# Patient Record
Sex: Female | Born: 1985 | Race: White | Hispanic: No | Marital: Single
Health system: Southern US, Community
[De-identification: ages and names within clinical notes are randomized; demographics above are authoritative.]

## PROBLEM LIST (undated history)

## (undated) DIAGNOSIS — F32A Depression, unspecified: Secondary | ICD-10-CM

## (undated) DIAGNOSIS — F329 Major depressive disorder, single episode, unspecified: Secondary | ICD-10-CM

## (undated) SURGERY — OPEN REDUCTION INTERNAL FIXATION (ORIF) DISTAL HUMERUS FRACTURE
Anesthesia: Choice | Laterality: Left

---

## 2016-05-16 ENCOUNTER — Emergency Department (HOSPITAL_COMMUNITY): Payer: No Typology Code available for payment source

## 2016-05-16 ENCOUNTER — Observation Stay (HOSPITAL_COMMUNITY): Payer: No Typology Code available for payment source

## 2016-05-16 ENCOUNTER — Inpatient Hospital Stay (HOSPITAL_COMMUNITY)
Admission: EM | Admit: 2016-05-16 | Discharge: 2016-05-19 | DRG: 493 | Disposition: A | Discharge status: Home / Self Care | Payer: No Typology Code available for payment source | Attending: General Surgery | Admitting: General Surgery

## 2016-05-16 ENCOUNTER — Encounter (HOSPITAL_COMMUNITY): Payer: Self-pay | Admitting: Emergency Medicine

## 2016-05-16 DIAGNOSIS — S52002A Unspecified fracture of upper end of left ulna, initial encounter for closed fracture: Secondary | ICD-10-CM

## 2016-05-16 DIAGNOSIS — S32029A Unspecified fracture of second lumbar vertebra, initial encounter for closed fracture: Secondary | ICD-10-CM | POA: Diagnosis present

## 2016-05-16 DIAGNOSIS — S32019A Unspecified fracture of first lumbar vertebra, initial encounter for closed fracture: Secondary | ICD-10-CM | POA: Diagnosis present

## 2016-05-16 DIAGNOSIS — S42409A Unspecified fracture of lower end of unspecified humerus, initial encounter for closed fracture: Secondary | ICD-10-CM | POA: Diagnosis present

## 2016-05-16 DIAGNOSIS — S42402A Unspecified fracture of lower end of left humerus, initial encounter for closed fracture: Secondary | ICD-10-CM | POA: Diagnosis not present

## 2016-05-16 DIAGNOSIS — R4182 Altered mental status, unspecified: Secondary | ICD-10-CM | POA: Diagnosis not present

## 2016-05-16 DIAGNOSIS — S060X9A Concussion with loss of consciousness of unspecified duration, initial encounter: Secondary | ICD-10-CM | POA: Diagnosis present

## 2016-05-16 DIAGNOSIS — S60221A Contusion of right hand, initial encounter: Secondary | ICD-10-CM | POA: Diagnosis present

## 2016-05-16 DIAGNOSIS — S21119A Laceration without foreign body of unspecified front wall of thorax without penetration into thoracic cavity, initial encounter: Secondary | ICD-10-CM | POA: Diagnosis present

## 2016-05-16 DIAGNOSIS — S32039A Unspecified fracture of third lumbar vertebra, initial encounter for closed fracture: Secondary | ICD-10-CM | POA: Diagnosis present

## 2016-05-16 DIAGNOSIS — F329 Major depressive disorder, single episode, unspecified: Secondary | ICD-10-CM | POA: Diagnosis present

## 2016-05-16 DIAGNOSIS — S060XAA Concussion with loss of consciousness status unknown, initial encounter: Secondary | ICD-10-CM | POA: Diagnosis present

## 2016-05-16 DIAGNOSIS — S52609A Unspecified fracture of lower end of unspecified ulna, initial encounter for closed fracture: Secondary | ICD-10-CM | POA: Diagnosis present

## 2016-05-16 HISTORY — DX: Depression, unspecified: F32.A

## 2016-05-16 HISTORY — DX: Major depressive disorder, single episode, unspecified: F32.9

## 2016-05-16 LAB — COMPREHENSIVE METABOLIC PANEL
ALBUMIN: 3.9 g/dL (ref 3.5–5.0)
ALT: 42 U/L (ref 14–54)
ANION GAP: 8 (ref 5–15)
AST: 40 U/L (ref 15–41)
Alkaline Phosphatase: 37 U/L — ABNORMAL LOW (ref 38–126)
BILIRUBIN TOTAL: 0.6 mg/dL (ref 0.3–1.2)
BUN: 11 mg/dL (ref 6–20)
CHLORIDE: 104 mmol/L (ref 101–111)
CO2: 23 mmol/L (ref 22–32)
Calcium: 9.5 mg/dL (ref 8.9–10.3)
Creatinine, Ser: 1.04 mg/dL — ABNORMAL HIGH (ref 0.44–1.00)
GFR calc Af Amer: >60 mL/min (ref >60)
GLUCOSE: 156 mg/dL — AB (ref 65–99)
POTASSIUM: 3.8 mmol/L (ref 3.5–5.1)
Sodium: 135 mmol/L (ref 135–145)
TOTAL PROTEIN: 7.6 g/dL (ref 6.5–8.1)

## 2016-05-16 LAB — URINALYSIS, ROUTINE W REFLEX MICROSCOPIC
Bilirubin Urine: NEGATIVE
Glucose, UA: NEGATIVE mg/dL
Ketones, ur: NEGATIVE mg/dL
Leukocytes, UA: NEGATIVE
NITRITE: NEGATIVE
PH: 8 (ref 5.0–8.0)
Protein, ur: NEGATIVE mg/dL
SPECIFIC GRAVITY, URINE: 1.02 (ref 1.005–1.030)

## 2016-05-16 LAB — I-STAT CHEM 8, ED
BUN: 12 mg/dL (ref 6–20)
CALCIUM ION: 1.12 mmol/L (ref 1.12–1.23)
Chloride: 101 mmol/L (ref 101–111)
Creatinine, Ser: 0.9 mg/dL (ref 0.44–1.00)
Glucose, Bld: 156 mg/dL — ABNORMAL HIGH (ref 65–99)
HEMATOCRIT: 47 % — AB (ref 36.0–46.0)
HEMOGLOBIN: 16 g/dL — AB (ref 12.0–15.0)
Potassium: 3.8 mmol/L (ref 3.5–5.1)
SODIUM: 138 mmol/L (ref 135–145)
TCO2: 23 mmol/L (ref 0–100)

## 2016-05-16 LAB — CBC
HEMATOCRIT: 43.6 % (ref 36.0–46.0)
HEMOGLOBIN: 15.3 g/dL — AB (ref 12.0–15.0)
MCH: 31.9 pg (ref 26.0–34.0)
MCHC: 35.1 g/dL (ref 30.0–36.0)
MCV: 90.8 fL (ref 78.0–100.0)
Platelets: 265 10*3/uL (ref 150–400)
RBC: 4.8 MIL/uL (ref 3.87–5.11)
RDW: 13.1 % (ref 11.5–15.5)
WBC: 8.4 10*3/uL (ref 4.0–10.5)

## 2016-05-16 LAB — RAPID URINE DRUG SCREEN, HOSP PERFORMED
Amphetamines: NOT DETECTED
BARBITURATES: NOT DETECTED
Benzodiazepines: NOT DETECTED
Cocaine: NOT DETECTED
Opiates: POSITIVE — AB
TETRAHYDROCANNABINOL: NOT DETECTED

## 2016-05-16 LAB — I-STAT CG4 LACTIC ACID, ED: LACTIC ACID, VENOUS: 2.88 mmol/L — AB (ref 0.5–2.0)

## 2016-05-16 LAB — URINE MICROSCOPIC-ADD ON

## 2016-05-16 LAB — SAMPLE TO BLOOD BANK

## 2016-05-16 LAB — PROTIME-INR
INR: 1.04 (ref 0.00–1.49)
PROTHROMBIN TIME: 13.8 s (ref 11.6–15.2)

## 2016-05-16 LAB — ETHANOL: Alcohol, Ethyl (B): <5 mg/dL (ref <5)

## 2016-05-16 LAB — POC URINE PREG, ED: Preg Test, Ur: NEGATIVE

## 2016-05-16 LAB — CDS SEROLOGY

## 2016-05-16 MED ORDER — HYDROMORPHONE HCL 1 MG/ML IJ SOLN
1.0000 mg | INTRAMUSCULAR | Status: DC | PRN
  Administered 2016-05-16 (×2): 1 mg via INTRAVENOUS
  Filled 2016-05-16 (×2): qty 1

## 2016-05-16 MED ORDER — CHLORHEXIDINE GLUCONATE 4 % EX LIQD
60.0000 mL | Freq: Once | CUTANEOUS | Status: DC
  Filled 2016-05-16: qty 60

## 2016-05-16 MED ORDER — HYDROMORPHONE HCL 1 MG/ML IJ SOLN
1.0000 mg | Freq: Once | INTRAMUSCULAR | Status: AC
  Administered 2016-05-16: 1 mg via INTRAVENOUS
  Filled 2016-05-16: qty 1

## 2016-05-16 MED ORDER — SODIUM CHLORIDE 0.9 % IV SOLN
INTRAVENOUS | Status: DC
  Administered 2016-05-16: 16:00:00 via INTRAVENOUS

## 2016-05-16 MED ORDER — SODIUM CHLORIDE 0.9% FLUSH
9.0000 mL | INTRAVENOUS | Status: DC | PRN
Start: 2016-05-16 — End: 2016-05-18

## 2016-05-16 MED ORDER — OXYCODONE HCL 5 MG PO TABS
10.0000 mg | ORAL_TABLET | ORAL | Status: DC | PRN
  Administered 2016-05-16 – 2016-05-18 (×5): 10 mg via ORAL
  Filled 2016-05-16 (×5): qty 2

## 2016-05-16 MED ORDER — DIPHENHYDRAMINE HCL 12.5 MG/5ML PO ELIX
12.5000 mg | ORAL_SOLUTION | Freq: Four times a day (QID) | ORAL | Status: DC | PRN
  Administered 2016-05-18: 12.5 mg via ORAL
  Filled 2016-05-16: qty 10

## 2016-05-16 MED ORDER — IOPAMIDOL (ISOVUE-300) INJECTION 61%
INTRAVENOUS | Status: AC
  Administered 2016-05-16: 100 mL
  Filled 2016-05-16: qty 100

## 2016-05-16 MED ORDER — DIPHENHYDRAMINE HCL 50 MG/ML IJ SOLN: 12.5000 mg | Freq: Four times a day (QID) | INTRAMUSCULAR | Status: DC | PRN

## 2016-05-16 MED ORDER — HYDROMORPHONE HCL 1 MG/ML IJ SOLN
1.0000 mg | INTRAMUSCULAR | Status: DC | PRN
  Administered 2016-05-16 (×2): 1 mg via INTRAVENOUS
  Administered 2016-05-17: 21:00:00 via INTRAVENOUS
  Filled 2016-05-16 (×2): qty 1

## 2016-05-16 MED ORDER — ONDANSETRON HCL 4 MG PO TABS: 4.0000 mg | ORAL_TABLET | Freq: Four times a day (QID) | ORAL | Status: DC | PRN

## 2016-05-16 MED ORDER — OXYCODONE HCL 5 MG PO TABS: 5.0000 mg | ORAL_TABLET | ORAL | Status: DC | PRN

## 2016-05-16 MED ORDER — SODIUM CHLORIDE 0.9 % IV BOLUS (SEPSIS)
1000.0000 mL | Freq: Once | INTRAVENOUS | Status: AC
  Administered 2016-05-16: 1000 mL via INTRAVENOUS

## 2016-05-16 MED ORDER — ENOXAPARIN SODIUM 40 MG/0.4ML ~~LOC~~ SOLN
40.0000 mg | SUBCUTANEOUS | Status: DC
  Administered 2016-05-19: 40 mg via SUBCUTANEOUS
  Filled 2016-05-16 (×2): qty 0.4

## 2016-05-16 MED ORDER — CEFAZOLIN SODIUM-DEXTROSE 2-4 GM/100ML-% IV SOLN
2.0000 g | INTRAVENOUS | Status: DC
  Filled 2016-05-16: qty 100

## 2016-05-16 MED ORDER — TETANUS-DIPHTH-ACELL PERTUSSIS 5-2.5-18.5 LF-MCG/0.5 IM SUSP
0.5000 mL | Freq: Once | INTRAMUSCULAR | Status: AC
  Administered 2016-05-16: 0.5 mL via INTRAMUSCULAR
  Filled 2016-05-16: qty 0.5

## 2016-05-16 MED ORDER — NALOXONE HCL 0.4 MG/ML IJ SOLN: 0.4000 mg | INTRAMUSCULAR | Status: DC | PRN

## 2016-05-16 MED ORDER — HYDROMORPHONE 1 MG/ML IV SOLN
INTRAVENOUS | Status: DC
  Administered 2016-05-16 – 2016-05-17 (×2): via INTRAVENOUS
  Administered 2016-05-17: 3.3 mg via INTRAVENOUS
  Administered 2016-05-17: 2.4 mg via INTRAVENOUS
  Administered 2016-05-17: 3.9 mg via INTRAVENOUS
  Administered 2016-05-17: 5.1 mg via INTRAVENOUS
  Administered 2016-05-18: 2.2 mg via INTRAVENOUS
  Administered 2016-05-18: 4.1 mg via INTRAVENOUS
  Filled 2016-05-16 (×2): qty 25

## 2016-05-16 MED ORDER — ONDANSETRON HCL 4 MG/2ML IJ SOLN: 4.0000 mg | Freq: Four times a day (QID) | INTRAMUSCULAR | Status: DC | PRN

## 2016-05-16 NOTE — ED Notes (Signed)
Patient returned from X-ray 

## 2016-05-16 NOTE — ED Notes (Signed)
Pt out of room for testing. 

## 2016-05-16 NOTE — Progress Notes (Signed)
Dr. Mina MarbleWeingold called per family request, family was concerned about upcoming surgery and had several questions.  Dr. Bernita RaisinWeigngold indicated he will speak with family in person this evening.  Family made aware.
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
## 2016-05-16 NOTE — ED Notes (Signed)
Ortho Tech at bedside for splint placement

## 2016-05-16 NOTE — Progress Notes (Signed)
Patient indicates her pain is not being controlled with current pain medication regimen.  MD paged orders to be entered.
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
## 2016-05-16 NOTE — ED Notes (Signed)
Patient transported to CT 
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
## 2016-05-16 NOTE — Progress Notes (Signed)
PCA set-up per order for improved pain control.  Family received education on PCA usage, incentive spirometry, arm elevation, and cold therapy.

## 2016-05-16 NOTE — Progress Notes (Signed)
Orthopedic Tech Progress Note Patient Details:  Valetta MoleLeah C Reppucci 26-Aug-1986 [id number]  Ortho Devices Type of Ortho Device: Arm sling, Long arm splint Ortho Device/Splint Interventions: Application   Saul FordyceJennifer C Eleftherios Dudenhoeffer 05/16/2016, 1:07 PM
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
## 2016-05-17 ENCOUNTER — Encounter (HOSPITAL_COMMUNITY): Admission: EM | Disposition: A | Discharge status: Home / Self Care | Payer: Self-pay | Source: Home / Self Care

## 2016-05-17 ENCOUNTER — Observation Stay (HOSPITAL_COMMUNITY): Payer: No Typology Code available for payment source | Admitting: Anesthesiology

## 2016-05-17 ENCOUNTER — Encounter (HOSPITAL_COMMUNITY): Payer: Self-pay | Admitting: Certified Registered Nurse Anesthetist

## 2016-05-17 DIAGNOSIS — S21119A Laceration without foreign body of unspecified front wall of thorax without penetration into thoracic cavity, initial encounter: Secondary | ICD-10-CM | POA: Diagnosis present

## 2016-05-17 DIAGNOSIS — S32019A Unspecified fracture of first lumbar vertebra, initial encounter for closed fracture: Secondary | ICD-10-CM | POA: Diagnosis present

## 2016-05-17 DIAGNOSIS — S42402A Unspecified fracture of lower end of left humerus, initial encounter for closed fracture: Secondary | ICD-10-CM | POA: Diagnosis present

## 2016-05-17 DIAGNOSIS — S32029A Unspecified fracture of second lumbar vertebra, initial encounter for closed fracture: Secondary | ICD-10-CM | POA: Diagnosis present

## 2016-05-17 DIAGNOSIS — S32039A Unspecified fracture of third lumbar vertebra, initial encounter for closed fracture: Secondary | ICD-10-CM | POA: Diagnosis present

## 2016-05-17 DIAGNOSIS — S60221A Contusion of right hand, initial encounter: Secondary | ICD-10-CM | POA: Diagnosis present

## 2016-05-17 DIAGNOSIS — S52609A Unspecified fracture of lower end of unspecified ulna, initial encounter for closed fracture: Secondary | ICD-10-CM | POA: Diagnosis present

## 2016-05-17 DIAGNOSIS — F329 Major depressive disorder, single episode, unspecified: Secondary | ICD-10-CM | POA: Diagnosis present

## 2016-05-17 DIAGNOSIS — R4182 Altered mental status, unspecified: Secondary | ICD-10-CM | POA: Diagnosis present

## 2016-05-17 DIAGNOSIS — S060X9A Concussion with loss of consciousness of unspecified duration, initial encounter: Secondary | ICD-10-CM | POA: Diagnosis present

## 2016-05-17 HISTORY — PX: ORIF ELBOW FRACTURE: SHX5031

## 2016-05-17 LAB — MRSA PCR SCREENING: MRSA by PCR: NEGATIVE

## 2016-05-17 SURGERY — OPEN REDUCTION INTERNAL FIXATION (ORIF) ELBOW/OLECRANON FRACTURE
Anesthesia: Regional | Site: Elbow | Laterality: Left

## 2016-05-17 MED ORDER — HYDROMORPHONE HCL 1 MG/ML IJ SOLN
0.2500 mg | INTRAMUSCULAR | Status: DC | PRN
  Administered 2016-05-17 (×2): 0.5 mg via INTRAVENOUS

## 2016-05-17 MED ORDER — PROPOFOL 10 MG/ML IV BOLUS
INTRAVENOUS | Status: DC | PRN
  Administered 2016-05-17: 200 mg via INTRAVENOUS

## 2016-05-17 MED ORDER — FENTANYL CITRATE (PF) 250 MCG/5ML IJ SOLN
INTRAMUSCULAR | Status: AC
  Filled 2016-05-17: qty 5

## 2016-05-17 MED ORDER — ONDANSETRON HCL 4 MG/2ML IJ SOLN: 4.0000 mg | Freq: Four times a day (QID) | INTRAMUSCULAR | Status: DC | PRN

## 2016-05-17 MED ORDER — OXYCODONE HCL 5 MG/5ML PO SOLN: 5.0000 mg | Freq: Once | ORAL | Status: DC | PRN

## 2016-05-17 MED ORDER — FENTANYL CITRATE (PF) 250 MCG/5ML IJ SOLN
INTRAMUSCULAR | Status: DC | PRN
  Administered 2016-05-17: 100 ug via INTRAVENOUS
  Administered 2016-05-17 (×5): 50 ug via INTRAVENOUS
  Administered 2016-05-17: 150 ug via INTRAVENOUS

## 2016-05-17 MED ORDER — DEXAMETHASONE SODIUM PHOSPHATE 10 MG/ML IJ SOLN
INTRAMUSCULAR | Status: DC | PRN
  Administered 2016-05-17: 10 mg via INTRAVENOUS

## 2016-05-17 MED ORDER — LIDOCAINE HCL (CARDIAC) 20 MG/ML IV SOLN
INTRAVENOUS | Status: DC | PRN
  Administered 2016-05-17: 100 mg via INTRAVENOUS

## 2016-05-17 MED ORDER — ONDANSETRON HCL 4 MG/2ML IJ SOLN
INTRAMUSCULAR | Status: DC | PRN
  Administered 2016-05-17: 4 mg via INTRAVENOUS

## 2016-05-17 MED ORDER — OXYCODONE HCL 5 MG PO TABS: 5.0000 mg | ORAL_TABLET | Freq: Once | ORAL | Status: DC | PRN

## 2016-05-17 MED ORDER — METHOCARBAMOL 1000 MG/10ML IJ SOLN: 500.0000 mg | Freq: Four times a day (QID) | INTRAVENOUS | Status: DC | PRN

## 2016-05-17 MED ORDER — LIDOCAINE 2% (20 MG/ML) 5 ML SYRINGE
INTRAMUSCULAR | Status: AC
  Filled 2016-05-17: qty 5

## 2016-05-17 MED ORDER — ONDANSETRON HCL 4 MG/2ML IJ SOLN
INTRAMUSCULAR | Status: AC
  Filled 2016-05-17: qty 2

## 2016-05-17 MED ORDER — BUPIVACAINE-EPINEPHRINE (PF) 0.5% -1:200000 IJ SOLN
INTRAMUSCULAR | Status: DC | PRN
  Administered 2016-05-17: 30 mL via PERINEURAL

## 2016-05-17 MED ORDER — CEFAZOLIN SODIUM 1 G IJ SOLR
INTRAMUSCULAR | Status: DC | PRN
  Administered 2016-05-17: 2 g via INTRAMUSCULAR

## 2016-05-17 MED ORDER — SUCCINYLCHOLINE CHLORIDE 20 MG/ML IJ SOLN
INTRAMUSCULAR | Status: DC | PRN
  Administered 2016-05-17: 100 mg via INTRAVENOUS

## 2016-05-17 MED ORDER — HYDROMORPHONE HCL 1 MG/ML IJ SOLN
INTRAMUSCULAR | Status: AC
  Filled 2016-05-17: qty 1

## 2016-05-17 MED ORDER — SUCCINYLCHOLINE CHLORIDE 200 MG/10ML IV SOSY
PREFILLED_SYRINGE | INTRAVENOUS | Status: AC
  Filled 2016-05-17: qty 10

## 2016-05-17 MED ORDER — 0.9 % SODIUM CHLORIDE (POUR BTL) OPTIME
TOPICAL | Status: DC | PRN
  Administered 2016-05-17 (×2): 1000 mL

## 2016-05-17 MED ORDER — PROPOFOL 10 MG/ML IV BOLUS
INTRAVENOUS | Status: AC
  Filled 2016-05-17: qty 20

## 2016-05-17 MED ORDER — METHOCARBAMOL 500 MG PO TABS
500.0000 mg | ORAL_TABLET | Freq: Four times a day (QID) | ORAL | Status: DC | PRN
  Administered 2016-05-17 – 2016-05-18 (×4): 500 mg via ORAL
  Filled 2016-05-17 (×4): qty 1

## 2016-05-17 MED ORDER — LACTATED RINGERS IV SOLN
INTRAVENOUS | Status: DC | PRN
  Administered 2016-05-17 (×3): via INTRAVENOUS

## 2016-05-17 MED ORDER — MIDAZOLAM HCL 2 MG/2ML IJ SOLN
INTRAMUSCULAR | Status: DC | PRN
  Administered 2016-05-17: 2 mg via INTRAVENOUS

## 2016-05-17 MED ORDER — CEFAZOLIN SODIUM 1 G IJ SOLR
INTRAMUSCULAR | Status: AC
  Filled 2016-05-17: qty 20

## 2016-05-17 MED ORDER — DEXAMETHASONE SODIUM PHOSPHATE 10 MG/ML IJ SOLN
INTRAMUSCULAR | Status: AC
  Filled 2016-05-17: qty 1

## 2016-05-17 MED ORDER — MIDAZOLAM HCL 2 MG/2ML IJ SOLN
INTRAMUSCULAR | Status: AC
  Filled 2016-05-17: qty 2

## 2016-05-17 SURGICAL SUPPLY — 83 items
BANDAGE ACE 4X5 VEL STRL LF (GAUZE/BANDAGES/DRESSINGS) ×3 IMPLANT
BANDAGE ELASTIC 4 VELCRO ST LF (GAUZE/BANDAGES/DRESSINGS) IMPLANT
BIT DRILL 2.5X2.75 QC CALB (BIT) ×3 IMPLANT
BIT DRILL CALIBRATED 2.7 (BIT) ×2 IMPLANT
BIT DRILL CALIBRATED 2.7MM (BIT) ×1
BLADE AVERAGE 25MMX9MM (BLADE) ×1
BLADE AVERAGE 25X9 (BLADE) ×2 IMPLANT
BLADE MINI RND TIP GREEN BEAV (BLADE) IMPLANT
BNDG CMPR 9X4 STRL LF SNTH (GAUZE/BANDAGES/DRESSINGS) ×1
BNDG ESMARK 4X9 LF (GAUZE/BANDAGES/DRESSINGS) ×3 IMPLANT
BNDG GAUZE ELAST 4 BULKY (GAUZE/BANDAGES/DRESSINGS) ×3 IMPLANT
CLOSURE WOUND 1/2 X4 (GAUZE/BANDAGES/DRESSINGS)
CORDS BIPOLAR (ELECTRODE) ×3 IMPLANT
COVER MAYO STAND STRL (DRAPES) IMPLANT
COVER SURGICAL LIGHT HANDLE (MISCELLANEOUS) ×3 IMPLANT
CUFF TOURNIQUET SINGLE 18IN (TOURNIQUET CUFF) IMPLANT
CUFF TOURNIQUET SINGLE 24IN (TOURNIQUET CUFF) ×3 IMPLANT
DRAPE OEC MINIVIEW 54X84 (DRAPES) ×3 IMPLANT
DRAPE PROXIMA HALF (DRAPES) ×12 IMPLANT
DRAPE SURG 17X23 STRL (DRAPES) ×6 IMPLANT
ELECT NEEDLE TIP 2.8 STRL (NEEDLE) ×3 IMPLANT
GAUZE SPONGE 4X4 12PLY STRL (GAUZE/BANDAGES/DRESSINGS) IMPLANT
GAUZE XEROFORM 1X8 LF (GAUZE/BANDAGES/DRESSINGS) IMPLANT
GAUZE XEROFORM 5X9 LF (GAUZE/BANDAGES/DRESSINGS) ×3 IMPLANT
GLOVE SURG SYN 8.0 (GLOVE) ×3 IMPLANT
GOWN STRL REUS W/ TWL LRG LVL3 (GOWN DISPOSABLE) ×1 IMPLANT
GOWN STRL REUS W/ TWL XL LVL3 (GOWN DISPOSABLE) ×1 IMPLANT
GOWN STRL REUS W/TWL LRG LVL3 (GOWN DISPOSABLE) ×3
GOWN STRL REUS W/TWL XL LVL3 (GOWN DISPOSABLE) ×3
K-WIRE FIXATION 2.0X6 (WIRE) ×15
KIT BASIN OR (CUSTOM PROCEDURE TRAY) ×3 IMPLANT
KIT ROOM TURNOVER OR (KITS) ×3 IMPLANT
KWIRE FIXATION 2.0X6 (WIRE) ×5 IMPLANT
LOOP VESSEL MAXI BLUE (MISCELLANEOUS) ×3 IMPLANT
MANIFOLD NEPTUNE II (INSTRUMENTS) ×3 IMPLANT
NEEDLE HYPO 25GX1X1/2 BEV (NEEDLE) IMPLANT
NS IRRIG 1000ML POUR BTL (IV SOLUTION) ×3 IMPLANT
PACK ORTHO EXTREMITY (CUSTOM PROCEDURE TRAY) ×3 IMPLANT
PAD ARMBOARD 7.5X6 YLW CONV (MISCELLANEOUS) ×6 IMPLANT
PAD CAST 4YDX4 CTTN HI CHSV (CAST SUPPLIES) ×2 IMPLANT
PADDING CAST COTTON 4X4 STRL (CAST SUPPLIES) ×6
PENCIL BUTTON HOLSTER BLD 10FT (ELECTRODE) ×3 IMPLANT
PLATE DIST HUM MEDIAL LT SM (Plate) ×3 IMPLANT
PLATE LOCK LT SM (Plate) ×3 IMPLANT
PLATE OLECRANON SM (Plate) ×3 IMPLANT
SCREW CORT 3.5X26 (Screw) ×3 IMPLANT
SCREW CORT T15 26X3.5XST LCK (Screw) ×1 IMPLANT
SCREW CORT T15 28X3.5XST LCK (Screw) ×1 IMPLANT
SCREW CORTICAL 3.5X28MM (Screw) ×3 IMPLANT
SCREW CORTICAL LOW PROF 3.5X20 (Screw) ×3 IMPLANT
SCREW LOCK CORT STAR 3.5X12 (Screw) ×6 IMPLANT
SCREW LOCK CORT STAR 3.5X14 (Screw) ×3 IMPLANT
SCREW LOCK CORT STAR 3.5X16 (Screw) ×3 IMPLANT
SCREW LOCK CORT STAR 3.5X18 (Screw) ×3 IMPLANT
SCREW LOCK CORT STAR 3.5X38 (Screw) ×3 IMPLANT
SCREW LOCK CORT STAR 3.5X50 (Screw) ×3 IMPLANT
SCREW LOW PROFILE 18MMX3.5MM (Screw) ×9 IMPLANT
SCREW LOW PROFILE 22MMX3.5MM (Screw) ×9 IMPLANT
SCREW NON LOCKING LP 3.5 16MM (Screw) ×3 IMPLANT
SOLUTION BETADINE 4OZ (MISCELLANEOUS) IMPLANT
SPONGE GAUZE 4X4 12PLY STER LF (GAUZE/BANDAGES/DRESSINGS) ×3 IMPLANT
SPONGE LAP 18X18 X RAY DECT (DISPOSABLE) ×9 IMPLANT
SPONGE LAP 4X18 X RAY DECT (DISPOSABLE) ×6 IMPLANT
SPONGE SCRUB IODOPHOR (GAUZE/BANDAGES/DRESSINGS) IMPLANT
STAPLER VISISTAT 35W (STAPLE) ×6 IMPLANT
STRIP CLOSURE SKIN 1/2X4 (GAUZE/BANDAGES/DRESSINGS) IMPLANT
SUCTION FRAZIER HANDLE 10FR (MISCELLANEOUS) ×2
SUCTION TUBE FRAZIER 10FR DISP (MISCELLANEOUS) ×1 IMPLANT
SUT PROLENE 3 0 PS 2 (SUTURE) IMPLANT
SUT VIC AB 0 CT1 27 (SUTURE) ×3
SUT VIC AB 0 CT1 27XBRD ANBCTR (SUTURE) ×1 IMPLANT
SUT VIC AB 2-0 CT1 27 (SUTURE) ×6
SUT VIC AB 2-0 CT1 TAPERPNT 27 (SUTURE) ×2 IMPLANT
SUT VIC AB 2-0 FS1 27 (SUTURE) IMPLANT
SYR BULB 3OZ (MISCELLANEOUS) ×3 IMPLANT
SYR CONTROL 10ML LL (SYRINGE) IMPLANT
TOWEL OR 17X24 6PK STRL BLUE (TOWEL DISPOSABLE) ×3 IMPLANT
TOWEL OR 17X26 10 PK STRL BLUE (TOWEL DISPOSABLE) ×6 IMPLANT
TUBE CONNECTING 12'X1/4 (SUCTIONS) ×2
TUBE CONNECTING 12X1/4 (SUCTIONS) ×4 IMPLANT
UNDERPAD 30X30 INCONTINENT (UNDERPADS AND DIAPERS) IMPLANT
WASHER 3.5MM (Orthopedic Implant) ×6 IMPLANT
WATER STERILE IRR 1000ML POUR (IV SOLUTION) ×3 IMPLANT

## 2016-05-17 NOTE — Anesthesia Preprocedure Evaluation (Signed)
Anesthesia Evaluation  Patient identified by MRN, date of birth, ID band Patient awake    Reviewed: Allergy & Precautions, NPO status , Patient's Chart, lab work & pertinent test results  Airway Mallampati: II   Neck ROM: full    Dental   Pulmonary neg pulmonary ROS,    breath sounds clear to auscultation       Cardiovascular negative cardio ROS   Rhythm:regular Rate:Normal     Neuro/Psych PSYCHIATRIC DISORDERS Depression    GI/Hepatic   Endo/Other    Renal/GU      Musculoskeletal   Abdominal   Peds  Hematology   Anesthesia Other Findings   Reproductive/Obstetrics                             Anesthesia Physical Anesthesia Plan  ASA: I  Anesthesia Plan: General and Regional   Post-op Pain Management:  Regional for Post-op pain   Induction: Intravenous  Airway Management Planned: LMA  Additional Equipment:   Intra-op Plan:   Post-operative Plan:   Informed Consent: I have reviewed the patients History and Physical, chart, labs and discussed the procedure including the risks, benefits and alternatives for the proposed anesthesia with the patient or authorized representative who has indicated his/her understanding and acceptance.     Plan Discussed with: CRNA, Anesthesiologist and Surgeon  Anesthesia Plan Comments:         Anesthesia Quick Evaluation

## 2016-05-17 NOTE — Anesthesia Procedure Notes (Addendum)
Anesthesia Regional Block:  Supraclavicular block  Pre-Anesthetic Checklist: ,, timeout performed, Correct Patient, Correct Site, Correct Laterality, Correct Procedure, Correct Position, site marked, Risks and benefits discussed,  Surgical consent,  Pre-op evaluation,  At surgeon's request and post-op pain management  Laterality: Left  Prep: chloraprep       Needles:  Injection technique: Single-shot  Needle Type: Echogenic Stimulator Needle     Needle Length: 5cm 5 cm Needle Gauge: 22 and 22 G    Additional Needles:  Procedures: ultrasound guided (picture in chart) and nerve stimulator Supraclavicular block  Nerve Stimulator or Paresthesia:  Response: biceps flexion, 0.45 mA,   Additional Responses:   Narrative:  Start time: 05/17/2016 3:03 PM End time: 05/17/2016 3:13 PM Injection made incrementally with aspirations every 5 mL.  Performed by: Personally  Anesthesiologist: HODIERNE, ADAM  Additional Notes: Functioning IV was confirmed and monitors were applied.  A 50mm 22ga Arrow echogenic stimulator needle was used. Sterile prep and drape,hand hygiene and sterile gloves were used.  Negative aspiration and negative test dose prior to incremental administration of local anesthetic. The patient tolerated the procedure well.  Ultrasound guidance: relevent anatomy identified, needle position confirmed, local anesthetic spread visualized around nerve(s), vascular puncture avoided.  Image printed for medical record.    Procedure Name: Intubation Date/Time: 05/17/2016 3:46 PM Performed by: Melina SchoolsBANKS, Lajuane Leatham J Pre-anesthesia Checklist: Patient identified, Emergency Drugs available, Suction available, Patient being monitored and Timeout performed Patient Re-evaluated:Patient Re-evaluated prior to inductionOxygen Delivery Method: Circle system utilized Preoxygenation: Pre-oxygenation with 100% oxygen Intubation Type: IV induction Ventilation: Mask ventilation without  difficulty Laryngoscope Size: Mac and 3 Grade View: Grade I Tube type: Oral Tube size: 7.5 mm Number of attempts: 1 Airway Equipment and Method: Stylet Placement Confirmation: ETT inserted through vocal cords under direct vision,  positive ETCO2 and breath sounds checked- equal and bilateral Secured at: 23 cm Tube secured with: Tape Dental Injury: Teeth and Oropharynx as per pre-operative assessment

## 2016-05-17 NOTE — Op Note (Signed)
[id number] 

## 2016-05-17 NOTE — Transfer of Care (Signed)
Immediate Anesthesia Transfer of Care Note  Patient: Julie [name]  Procedure(s) Performed: Procedure(s): OPEN REDUCTION INTERNAL FIXATION (ORIF) ELBOW/OLECRANON FRACTURE (Left)  Patient Location: PACU  Anesthesia Type:GA combined with regional for post-op pain  Level of Consciousness: awake, alert , oriented and patient cooperative  Airway & Oxygen Therapy: Patient Spontanous Breathing and Patient connected to nasal cannula oxygen  Post-op Assessment: Report given to RN, Post -op Vital signs reviewed and stable and Moving extremities except left arm due to block  Post vital signs: Reviewed and stable  Last Vitals:  Filed Vitals:   05/17/16 1332 05/17/16 1509  BP:  137/60  Pulse:  103  Temp:    Resp: 20 15    Last Pain:  Filed Vitals:   05/17/16 1911  PainSc: 10-Worst pain ever         Complications: No apparent anesthesia complications
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
## 2016-05-18 ENCOUNTER — Encounter (HOSPITAL_COMMUNITY): Payer: Self-pay | Admitting: Orthopedic Surgery

## 2016-05-18 DIAGNOSIS — S060XAA Concussion with loss of consciousness status unknown, initial encounter: Secondary | ICD-10-CM | POA: Diagnosis present

## 2016-05-18 DIAGNOSIS — S060X9A Concussion with loss of consciousness of unspecified duration, initial encounter: Secondary | ICD-10-CM | POA: Diagnosis present

## 2016-05-18 MED ORDER — HYDROMORPHONE HCL 1 MG/ML IJ SOLN
1.0000 mg | INTRAMUSCULAR | Status: DC | PRN
  Administered 2016-05-18: 1 mg via INTRAMUSCULAR
  Filled 2016-05-18: qty 1

## 2016-05-18 MED ORDER — DOCUSATE SODIUM 100 MG PO CAPS
100.0000 mg | ORAL_CAPSULE | Freq: Two times a day (BID) | ORAL | Status: DC
  Administered 2016-05-18 – 2016-05-19 (×3): 100 mg via ORAL
  Filled 2016-05-18 (×3): qty 1

## 2016-05-18 MED ORDER — CLONAZEPAM 0.5 MG PO TABS
0.5000 mg | ORAL_TABLET | Freq: Three times a day (TID) | ORAL | Status: DC
  Administered 2016-05-18 – 2016-05-19 (×4): 0.5 mg via ORAL
  Filled 2016-05-18 (×4): qty 1

## 2016-05-18 MED ORDER — HYDROMORPHONE HCL 1 MG/ML IJ SOLN
1.0000 mg | Freq: Once | INTRAMUSCULAR | Status: AC
  Administered 2016-05-18: 1 mg via INTRAMUSCULAR

## 2016-05-18 MED ORDER — METHOCARBAMOL 750 MG PO TABS
1500.0000 mg | ORAL_TABLET | Freq: Four times a day (QID) | ORAL | Status: DC
  Administered 2016-05-18 – 2016-05-19 (×6): 1500 mg via ORAL
  Filled 2016-05-18 (×7): qty 2

## 2016-05-18 MED ORDER — POLYETHYLENE GLYCOL 3350 17 G PO PACK
17.0000 g | PACK | Freq: Every day | ORAL | Status: DC
  Administered 2016-05-18 – 2016-05-19 (×2): 17 g via ORAL
  Filled 2016-05-18 (×2): qty 1

## 2016-05-18 MED ORDER — ACETAMINOPHEN 500 MG PO TABS: 500.0000 mg | ORAL_TABLET | Freq: Four times a day (QID) | ORAL | Status: DC | PRN

## 2016-05-18 MED ORDER — CLONAZEPAM 0.5 MG PO TABS: 0.5000 mg | ORAL_TABLET | Freq: Every day | ORAL | Status: DC

## 2016-05-18 MED ORDER — OXYCODONE HCL ER 20 MG PO T12A
20.0000 mg | EXTENDED_RELEASE_TABLET | Freq: Two times a day (BID) | ORAL | Status: DC
  Administered 2016-05-18 – 2016-05-19 (×2): 20 mg via ORAL
  Filled 2016-05-18 (×2): qty 1

## 2016-05-18 MED ORDER — HYDROMORPHONE HCL 1 MG/ML IJ SOLN
1.0000 mg | INTRAMUSCULAR | Status: DC | PRN
  Filled 2016-05-18: qty 1

## 2016-05-18 MED ORDER — OXYCODONE HCL 5 MG PO TABS
10.0000 mg | ORAL_TABLET | ORAL | Status: DC | PRN
  Administered 2016-05-18 (×2): 10 mg via ORAL
  Administered 2016-05-18 – 2016-05-19 (×2): 20 mg via ORAL
  Filled 2016-05-18: qty 2
  Filled 2016-05-18 (×2): qty 4
  Filled 2016-05-18 (×2): qty 2

## 2016-05-18 MED ORDER — TRAMADOL HCL 50 MG PO TABS
100.0000 mg | ORAL_TABLET | Freq: Four times a day (QID) | ORAL | Status: DC
  Administered 2016-05-18 – 2016-05-19 (×5): 100 mg via ORAL
  Filled 2016-05-18 (×5): qty 2

## 2016-05-18 NOTE — Progress Notes (Signed)
PA Casimiro NeedleMichael jeffrey notifiedat 1235. of patient's fear of needle when IV team arrived to start IV order obtained for IM dilaudid if patient will take that and see if that will work until 1400.  Will call back if this does not work.
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
## 2016-05-18 NOTE — Progress Notes (Signed)
Call to Dr. Ronie SpiesWeingold's office at 1120, and spoke with his RN.  She will get in touch with Dr. Mina Marble"Weingold about restarting the PCA due to patient's uncontrolled pain.  Patient declined IV start when Iv team was here previously stating she did not want iv restarted until she was sure PCA would be reordered..Marland Kitchen

## 2016-05-18 NOTE — Op Note (Signed)
NAMLorretta Harp:  Julie [name], Julie [name]               ACCOUNT NO.:  [id number]  MEDICAL RECORD NO.:  [id number]  LOCATION:  5N03C                        FACILITY:  [facility]  PHYSICIAN:  Artist PaisMatthew A. Gweneth Fredlund, M.D.DATE OF BIRTH:  Aug 26, 1986  DATE OF PROCEDURE:  05/17/2016 DATE OF DISCHARGE:                              OPERATIVE REPORT   PREOPERATIVE DIAGNOSIS:  Complex intra-articular fracture, distal humerus left side.  POSTOPERATIVE DIAGNOSIS:  Complex intra-articular fracture, distal humerus left side.  PROCEDURE:  Open reduction and internal fixation of above.  SURGEON:  Artist PaisMatthew A. Mina MarbleWeingold, M.D. and Eulas PostJoshua P Landau, M.D.  ANESTHESIA:  Ax block and general.  TOURNIQUET TIME:  2 hours.  COMPLICATIONS:  None.  DRAINS:  None.  DESCRIPTION OF PROCEDURE:  The patient was taken to the operating suite. After induction of adequate axillary block analgesia and general endotracheal tube anesthetic, the patient was placed in lateral decubitus position with the left arm draped over a well-padded bolster. She was then prepped and draped in sterile fashion.  Sterile tourniquet was used. We then inflated the tourniquet to 250 mmHg.  At this point in time, a midline incision was made posteriorly over the distal humerus and olecranon area.  The skin was incised sharply.  Full thickness skin flaps were raised medially and laterally.  Laterally, we identified the ulnar nerve in the cubital tunnel and removed it proximally and distally and transposed it in interposition.  We then performed a chevron osteotomy of the proximal ulna using sagittal saw and an osteotome. Once we did this, we incised the medial and lateral gutters and the triceps tendon and reflected the triceps and the proximal ulna proximally to expose the fracture site.  We debrided the fracture site of clot.  We then performed fracture fixation using the following technique.  We were able to take the lateral aspect of the distal humerus and with  a 2-mm K-wire fixed it to the lateral column.  Once we achieved secure fixation, we placed a posterolateral plate with 3 cortical screws proximally and 3 locking screws distally to maintain the lateral column.  We then carefully pieced together the rest of the articular tip of the distal humerus and dropped the tourniquet to 2 hours.  We then took a pre-contoured medial plate, placed along the medial aspect of the distal humerus, and with a reduction clamp clamped the fragmentation.  Once this was done, we were able to place a home-run screw from medial to lateral across the intra-articular stool followed by 2 more screws distal and 2 cortical screws proximally to complete the fixation.  Intraoperative fluoroscopy revealed adequate reduction in AP, lateral and oblique views.  The patient had unrestricted motion on the table.  We irrigated thoroughly.  We then fixed the olecranon osteotomy with an olecranon plate from [employer] elbow plate system using 3 cortical screws distally, 2 locking screws proximally, and a home-run screw proximally.  Intraoperative fluoroscopy revealed adequate reduction in AP, lateral and oblique view.  We then closed the interval between the medial and lateral gutters with 0 Vicryl, and closed the subcutaneous tissues with 2-0 undyed Vicryl. Staples were placed on the skin.  Xeroform, 4x4s, fluffs, and a posterior splint  were applied.  The patient tolerated the procedure well and went to the recovery room in stable fashion.     Artist Pais Mina Marble, M.D.     MAW/MEDQ  D:  05/17/2016  T:  05/18/2016  Job:  [id number]

## 2016-05-18 NOTE — Progress Notes (Signed)
Dr. Janee Mornhompson by to see patient and report of pain meds given and orders from Earney HamburgMichael jeffrey reported to her RN, Nellie.

## 2016-05-18 NOTE — Op Note (Signed)
Julie [name], [name]               ACCOUNT NO.:  [id number]  MEDICAL RECORD NO.:  [id number]  LOCATION:  5N03C                        FACILITY:  [facility]  PHYSICIAN:  Artist Pais. Uma Jerde, M.D.DATE OF BIRTH:  [birth year]  DATE OF PROCEDURE:  05/17/2016 DATE OF DISCHARGE:                              OPERATIVE REPORT   PREOPERATIVE DIAGNOSIS:  Left intra-articular distal humerus fracture.  POSTOPERATIVE DIAGNOSIS:  Left intra-articular distal humerus fracture.  PROCEDURE:  Open reduction and internal fixation complex intra-articular fracture, distal humerus left side with transposition of ulnar nerve.  SURGEON:  Artist Pais. Mina Marble, M.D.  CO-SURGEON:  Eulas Post, M.D.  ANESTHESIA:  Block and general.  TOURNIQUET TIME:  2 hours.  COMPLICATIONS:  None.  DRAINS:  None.  DESCRIPTION OF PROCEDURE:  The patient was taken to the operating suite. After induction of axillary block analgesia and then a general endotracheal tube anesthetic, the patient was placed in the mid lateral position in a beanbag with her left arm draped over a padded bolster. She was then prepped and draped in usual sterile fashion.  A sterile tourniquet was applied and we exsanguinated and inflated it to 250 mmHg. At this point in time, a direct posterior approach to the distal humerus and the olecranon was undertaken.  Skin was incised sharply.  We dissected full-thickness flaps both medially and laterally.  Medially, we identified the interval between the tip of the olecranon and the medial epicondyle and identified the ulnar nerve.  We released the ulnar nerve proximally and distally and transposed it in an anterior position. We then performed a chevron osteotomy of the proximal ulna to gain access to the distal humerus. This was done using a sagittal saw and found the osteotomes to create our osteotomy.  We then incised the lateral and medial gutters of the triceps tendon proximally and elevated the  olecranon tip and triceps muscle proximally to expose the fracture site.  We debrided the fracture site of clot.  The fracture site was a complex intra-articular fracture with 3 parts at the distal articular surface as well as an intact lateral column but significant comminution along the medial column.  We carefully dissected the intra-articular components.  We temporarily fixed the trochlea and the capitellum area to the lateral column with a 2-mm K-wire.  Once this was completed, using a [employer] Elbow Plate System, we took a posterolateral plate and placed it directly posterior, fixed it with cortical screws proximally x2, followed by 3 locking distal screws into the capitellar fragment. Once this was done, we then carefully dissected the medial side and placed a pre-contoured medial plate along the medial side.  We were able to carefully tease the intra-articular components to re-align this pull and then we clamped this altogether and using a large screw we went from medial to lateral through the plate into the capitellum thus reducing the spindle.  We then placed one more locking screw distal to the home- run screw, followed by a unicortical screw and 2 cortical screws proximally to maintain fracture reduction.  The tourniquet was dropped to 2 hours. The medial plate was placed after the tourniquet was dropped. Intraoperative fluoroscopy revealed adequate reduction in  AP, lateral and oblique view.  We then placed an olecranon plate to fix the osteotomy of the olecranon using the [employer] precontoured olecranon plate set.  This was placed directly posteriorly and fixed under direct and fluoroscopic guidance.  Intraoperative fluoroscopy revealed adequate reduction in AP, lateral and oblique views.  We thoroughly irrigated, the patient had good motion on the table with no crepitus.  We then closed the interval between the medial and lateral gutters of the triceps and the main part of the  triceps with a running locked 0 Vicryl. A 2-0 Vicryl was used subcutaneously and we used staples on the skin. The patient was then placed in a sterile dressing of Xeroform, 4x4s, and a posterior elbow splint.  The patient tolerated the procedures well and went to recovery room in stable fashion.     Artist PaisMatthew A. Mina [name], M.D.     MAW/MEDQ  D:  05/17/2016  T:  05/18/2016  Job:  [id number]

## 2016-05-18 NOTE — Progress Notes (Signed)
Earney HamburgMichael Jeffrey Pa notified of patient's c/o right hand pain and swelling and her  request for ice with ace wrap - okay per Casimiro NeedleMichael.
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
## 2016-05-19 MED ORDER — TRAMADOL HCL 50 MG PO TABS: 100.0000 mg | ORAL_TABLET | Freq: Four times a day (QID) | ORAL | Status: AC

## 2016-05-19 MED ORDER — OXYCODONE HCL ER 10 MG PO T12A: EXTENDED_RELEASE_TABLET | ORAL | Status: AC

## 2016-05-19 MED ORDER — OXYCODONE HCL ER 10 MG PO T12A
10.0000 mg | EXTENDED_RELEASE_TABLET | Freq: Once | ORAL | Status: AC
Start: 2016-05-19 — End: 2016-05-19
  Administered 2016-05-19: 10 mg via ORAL
  Filled 2016-05-19: qty 1

## 2016-05-19 MED ORDER — OXYCODONE-ACETAMINOPHEN 10-325 MG PO TABS: 1.0000 | ORAL_TABLET | ORAL | Status: AC | PRN

## 2016-05-19 MED ORDER — METHOCARBAMOL 500 MG PO TABS: 1000.0000 mg | ORAL_TABLET | Freq: Four times a day (QID) | ORAL | Status: AC | PRN

## 2016-05-19 MED FILL — OxyCONTIN 10 MG T12A: 10 | 14 days supply | Qty: 42 | Fill #0

## 2016-05-19 MED FILL — METHOCARBAMOL 500 MG TABLET: 500 | 12 days supply | Qty: 100 | Fill #0

## 2016-05-19 MED FILL — traMADol HCL 50 MG TABS: 50 | 12 days supply | Qty: 100 | Fill #0

## 2016-05-19 NOTE — Anesthesia Postprocedure Evaluation (Signed)
Anesthesia Post Note  Patient: Julie [name]  Procedure(s) Performed: Procedure(s) (LRB): OPEN REDUCTION INTERNAL FIXATION (ORIF) ELBOW/OLECRANON FRACTURE (Left)  Patient location during evaluation: PACU Anesthesia Type: General Level of consciousness: awake and alert and patient cooperative Pain management: pain level controlled Vital Signs Assessment: post-procedure vital signs reviewed and stable Respiratory status: spontaneous breathing and respiratory function stable Cardiovascular status: stable Anesthetic complications: no    Last Vitals:  Filed Vitals:   05/18/16 1954 05/19/16 0545  BP: 117/66 117/54  Pulse: 77 80  Temp: 36.8 C 37.3 C  Resp: 18 16    Last Pain:  Filed Vitals:   05/19/16 1249  PainSc: 7                  [name] S
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
## 2016-05-19 NOTE — Progress Notes (Signed)
Julie MoleLeah C [name] to be D/C'd Home per MD order.  Discussed with the patient and all questions fully answered.  VSS, Skin clean, dry and intact without evidence of skin break down, no evidence of skin tears noted.Marland Kitchen.  An After Visit Summary was printed and given to the patient. Patient received prescription.  D/c education completed with patient and mother including follow up instructions, medication list, d/c activities limitations if indicated, with other d/c instructions as indicated by MD - patient able to verbalize understanding, all questions fully answered.   Patient instructed to return to ED, call 911, or call MD for any changes in condition.   Patient escorted via WC, and D/C home via private auto.  [name] 05/19/2016 3:04 PM
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
## 2016-05-19 NOTE — Discharge Instructions (Signed)
No lifting, pushing, pulling with left arm.  Keep splint and dressing dry and intact.  No driving while taking oxycodone.
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
